# Patient Record
Sex: Female | Born: 1995
Health system: Southern US, Community
[De-identification: ages and names within clinical notes are randomized; demographics above are authoritative.]

## PROBLEM LIST (undated history)

## (undated) HISTORY — PX: TONSILLECTOMY: SUR1361

## (undated) HISTORY — PX: ORTHOPEDIC SURGERY: SHX850

## (undated) HISTORY — PX: APPENDECTOMY: SHX54

---

## 2015-03-15 ENCOUNTER — Emergency Department (HOSPITAL_BASED_OUTPATIENT_CLINIC_OR_DEPARTMENT_OTHER)
Admission: EM | Admit: 2015-03-15 | Discharge: 2015-03-15 | Disposition: A | Discharge status: Home / Self Care | Payer: Managed Care, Other (non HMO) | Attending: Emergency Medicine | Admitting: Emergency Medicine

## 2015-03-15 ENCOUNTER — Encounter (HOSPITAL_BASED_OUTPATIENT_CLINIC_OR_DEPARTMENT_OTHER): Payer: Self-pay | Admitting: Emergency Medicine

## 2015-03-15 DIAGNOSIS — H578 Other specified disorders of eye and adnexa: Secondary | ICD-10-CM | POA: Diagnosis present

## 2015-03-15 DIAGNOSIS — H109 Unspecified conjunctivitis: Secondary | ICD-10-CM

## 2015-03-15 MED ORDER — TETRACAINE HCL 0.5 % OP SOLN: 1.0000 [drp] | Freq: Once | OPHTHALMIC | Status: DC

## 2015-03-15 MED ORDER — FLUORESCEIN SODIUM 1 MG OP STRP: 1.0000 | ORAL_STRIP | Freq: Once | OPHTHALMIC | Status: DC

## 2015-03-15 MED ORDER — CIPROFLOXACIN HCL 0.3 % OP SOLN
2.0000 [drp] | OPHTHALMIC | Status: DC
  Administered 2015-03-15: 2 [drp] via OPHTHALMIC
  Filled 2015-03-15: qty 2.5

## 2015-03-15 MED ORDER — TETRACAINE HCL 0.5 % OP SOLN
OPHTHALMIC | Status: AC
  Filled 2015-03-15: qty 2

## 2015-03-15 MED ORDER — FLUORESCEIN SODIUM 1 MG OP STRP
ORAL_STRIP | OPHTHALMIC | Status: AC
  Filled 2015-03-15: qty 1

## 2015-03-15 MED ORDER — CIPROFLOXACIN HCL 0.3 % OP SOLN: 2.0000 [drp] | OPHTHALMIC | Status: DC

## 2015-03-15 NOTE — ED Provider Notes (Signed)
CSN: [id number]     Arrival date & time 03/15/15  0000 History  This chart was scribed for Paula LibraJohn Isel Skufca, MD by Ronney LionSuzanne Le, ED Scribe. This patient was seen in room MH01/MH01 and the patient's care was started at 12:26 AM.      Chief Complaint  Patient presents with  . Eye Pain and Swelling    The history is provided by the patient. No language interpreter was used.     HPI Comments: Kelsey Daniel is a 19 y.o. female who presents to the Emergency Department complaining of constant, worsening left eye pain that began without provocation 8 hours ago and gradually began to swell increasingly throughout the day. She endorses associated blurred vision. Patient states she was in the middle of a URI when this onset.   History reviewed. No pertinent past medical history. Past Surgical History  Procedure Laterality Date  . Tonsillectomy    . Orthopedic surgery     No family history on file. History  Substance Use Topics  . Smoking status: Never Smoker   . Smokeless tobacco: Not on file  . Alcohol Use: Yes     Comment: occ   OB History    No data available     Review of Systems  A complete 10 system review of systems was obtained and all systems are negative except as noted in the HPI and PMH.    Allergies  Review of patient's allergies indicates no known allergies.  Home Medications   Prior to Admission medications   Medication Sig Start Date End Date Taking? Authorizing Provider  levonorgestrel (MIRENA) 20 MCG/24HR IUD 1 each by Intrauterine route once.   Yes Historical Provider, MD   BP 142/99 mmHg  Pulse 83  Temp(Src) 98.5 F (36.9 C) (Oral)  Resp 18  Ht 5\' 9"  (1.753 m)  Wt 128 lb (58.06 kg)  BMI 18.89 kg/m2  SpO2 96%   Physical Exam  Nursing note and vitals reviewed. General: Well-developed, well-nourished female in no acute distress; appearance consistent with age of record HENT: normocephalic; atraumatic Eyes: pupils equal, round and reactive to light; extraocular  muscles intact; edema, without erythema, of the left eyelids with left conjunctival injection and mucoid discharge; no tenderness over the tear duct medially; no fluorescein uptake of the left cornea Neck: supple Heart: regular rate and rhythm Lungs: clear to auscultation bilaterally Abdomen: soft; nondistended; nontender; no masses or hepatosplenomegaly; bowel sounds present Extremities: No deformity; full range of motion; pulses normal Neurologic: Awake, alert and oriented; motor function intact in all extremities and symmetric; no facial droop Skin: Warm and dry Psychiatric: Normal mood and affect   ED Course  Procedures (including critical care time)  DIAGNOSTIC STUDIES: Oxygen Saturation is 96% on RA, normal by my interpretation.    COORDINATION OF CARE: 12:34 AM - Suspect conjunctivitis. Discussed treatment plan with pt at bedside which includes antibiotic eyedrops, and pt agreed to plan.  MDM   Final diagnoses:  Conjunctivitis of left eye   I personally performed the services described in this documentation, which was scribed in my presence. The recorded information has been reviewed and is accurate.    Paula LibraJohn Krimson Massmann, MD 03/15/15 [phone]

## 2015-03-15 NOTE — ED Notes (Signed)
Left eye swollen and painful

## 2015-03-15 NOTE — Discharge Instructions (Signed)

## 2016-09-27 ENCOUNTER — Emergency Department (HOSPITAL_BASED_OUTPATIENT_CLINIC_OR_DEPARTMENT_OTHER)
Admission: EM | Admit: 2016-09-27 | Discharge: 2016-09-27 | Disposition: A | Discharge status: Home / Self Care | Payer: Managed Care, Other (non HMO) | Attending: Emergency Medicine | Admitting: Emergency Medicine

## 2016-09-27 ENCOUNTER — Encounter (HOSPITAL_BASED_OUTPATIENT_CLINIC_OR_DEPARTMENT_OTHER): Payer: Self-pay | Admitting: *Deleted

## 2016-09-27 DIAGNOSIS — H6011 Cellulitis of right external ear: Secondary | ICD-10-CM | POA: Diagnosis not present

## 2016-09-27 DIAGNOSIS — R220 Localized swelling, mass and lump, head: Secondary | ICD-10-CM | POA: Diagnosis present

## 2016-09-27 MED ORDER — CEPHALEXIN 500 MG PO CAPS: 500.0000 mg | ORAL_CAPSULE | Freq: Four times a day (QID) | ORAL | 0 refills | Status: DC

## 2016-09-27 MED FILL — CEPHALEXIN 500 MG CAPSULE: 500 | 5 days supply | Qty: 20 | Fill #0

## 2016-09-27 NOTE — ED Provider Notes (Signed)
[facility]   CSN: [id number] Arrival date & time: 09/27/16  1507     History   Chief Complaint Chief Complaint  Patient presents with  . Abscess    HPI Kelsey Daniel is a 20 y.o. female.  HPI Patient presents with right ear redness and swelling that began 5 days ago.  She states it has gotten worse.  Mild pain.  No medications PTA.  Nothing makes her symptoms better or worse.  She states "my skin kept coming off" and she was applying hydrogen peroxide to the area.  No fever, chills, or any other symptoms. She did have a new ear piercing in that area about 6 weeks ago.  History reviewed. No pertinent past medical history.  There are no active problems to display for this patient.   Past Surgical History:  Procedure Laterality Date  . APPENDECTOMY    . ORTHOPEDIC SURGERY    . TONSILLECTOMY      OB History    No data available       Home Medications    Prior to Admission medications   Medication Sig Start Date End Date Taking? Authorizing Provider  cephALEXin (KEFLEX) 500 MG capsule Take 1 capsule (500 mg total) by mouth 4 (four) times daily. 09/27/16   Kelsey FowlerKayla Lulamae Skorupski, PA-C  levonorgestrel (MIRENA) 20 MCG/24HR IUD 1 each by Intrauterine route once.    Historical Provider, MD    Family History No family history on file.  Social History Social History  Substance Use Topics  . Smoking status: Never Smoker  . Smokeless tobacco: Never Used  . Alcohol use Yes     Comment: occ     Allergies   Patient has no known allergies.   Review of Systems Review of Systems All other systems negative unless otherwise stated in HPI   Physical Exam Updated Vital Signs BP 140/77 (BP Location: Left Arm)   Pulse 79   Temp 98.1 F (36.7 C) (Oral)   Resp 18   Ht 5\' 9"  (1.753 m)   Wt 63.5 kg   LMP 09/02/2016 (Approximate)   SpO2 99%   BMI 20.67 kg/m   Physical Exam  Constitutional: She is oriented to person, place, and time. She appears  well-developed and well-nourished.  HENT:  Head: Normocephalic and atraumatic.  Right Ear: External ear normal.  Left Ear: External ear normal.  Eyes: Conjunctivae are normal. No scleral icterus.  Neck: No tracheal deviation present.  Pulmonary/Chest: Effort normal. No respiratory distress.  Abdominal: She exhibits no distension.  Musculoskeletal: Normal range of motion.  Neurological: She is alert and oriented to person, place, and time.  Skin: Skin is warm and dry.  5 mm area of erythema and swelling to mid posterior helix.  No mastoid tenderness.  Psychiatric: She has a normal mood and affect. Her behavior is normal.     ED Treatments / Results  Labs (all labs ordered are listed, but only abnormal results are displayed) Labs Reviewed - No data to display  EKG  EKG Interpretation None       Radiology No results found.  Procedures Procedures (including critical care time)  Medications Ordered in ED Medications - No data to display   Initial Impression / Assessment and Plan / ED Course  I have reviewed the triage vital signs and the nursing notes.  Pertinent labs & imaging results that were available during my care of the patient were reviewed by me and considered in my medical decision  making (see chart for details).  Clinical Course    Patient with recent ear piercing presents with left ear erythema and swelling.  No systemic symptoms.  Concern for infectious vs inflammatory etiology.  Discharge home with keflex.  Recommend ibuprofen for pain.  Return precautions discussed.  Stable for discharge.  Final Clinical Impressions(s) / ED Diagnoses   Final diagnoses:  Cellulitis of earlobe, right    New Prescriptions New Prescriptions   CEPHALEXIN (KEFLEX) 500 MG CAPSULE    Take 1 capsule (500 mg total) by mouth 4 (four) times daily.     Kelsey FowlerKayla Earline Stiner, PA-C 09/27/16 1545    Geoffery Lyonsouglas Delo, MD 09/27/16 [phone]

## 2016-09-27 NOTE — Discharge Instructions (Signed)
Your symptoms could be infectious or inflammatory.  To cover for infection, start taking Keflex four times daily for 7 days.  You may take 800 mg Ibuprofen or extra strength tylenol every 6 hours for pain.  Return to the ED if you experience increased swelling, redness, pain, fever, or any new or concerning symptoms.

## 2016-09-27 NOTE — ED Notes (Signed)
Pt directed to pharmacy to pick up Rx 

## 2016-09-27 NOTE — ED Triage Notes (Signed)
Pt reports swelling to back of right ear since Saturday. New piercing 6 weeks ago. Pain is now in her neck

## 2016-09-28 ENCOUNTER — Encounter (HOSPITAL_BASED_OUTPATIENT_CLINIC_OR_DEPARTMENT_OTHER): Payer: Self-pay | Admitting: Emergency Medicine

## 2016-09-28 ENCOUNTER — Emergency Department (HOSPITAL_BASED_OUTPATIENT_CLINIC_OR_DEPARTMENT_OTHER)
Admission: EM | Admit: 2016-09-28 | Discharge: 2016-09-28 | Disposition: A | Discharge status: Home / Self Care | Payer: Managed Care, Other (non HMO) | Attending: Emergency Medicine | Admitting: Emergency Medicine

## 2016-09-28 DIAGNOSIS — Z79899 Other long term (current) drug therapy: Secondary | ICD-10-CM | POA: Diagnosis not present

## 2016-09-28 DIAGNOSIS — H6001 Abscess of right external ear: Secondary | ICD-10-CM | POA: Diagnosis not present

## 2016-09-28 DIAGNOSIS — H938X1 Other specified disorders of right ear: Secondary | ICD-10-CM | POA: Diagnosis present

## 2016-09-28 MED ORDER — SULFAMETHOXAZOLE-TRIMETHOPRIM 800-160 MG PO TABS: 1.0000 | ORAL_TABLET | Freq: Two times a day (BID) | ORAL | 0 refills | Status: AC

## 2016-09-28 NOTE — ED Notes (Signed)
ED Provider at bedside. 

## 2016-09-28 NOTE — ED Triage Notes (Signed)
Pt seen yesterday for cellulitis of right earlobe.  Sts she was told that if it got worse to come back, and it is worse.  Sts she has been taking the Keflex as prescribed.

## 2016-09-28 NOTE — ED Provider Notes (Signed)
[facility]   CSN: [id number] Arrival date & time: 09/28/16  1726     History   Chief Complaint Chief Complaint  Patient presents with  . Wound Check    HPI Kelsey Daniel is a 20 y.o. female.  HPI  20 year old female presents with right ear swelling and redness. She was seen here yesterday and told to come back and got worse. She has taken 4 doses of her Keflex and still feels like it's worsening. She tried to put in an earring a few days ago and noticed a low but of yellow drainage when she did. She has not tried this since. Otherwise it has not been draining. No fevers.  History reviewed. No pertinent past medical history.  There are no active problems to display for this patient.   Past Surgical History:  Procedure Laterality Date  . APPENDECTOMY    . ORTHOPEDIC SURGERY    . TONSILLECTOMY      OB History    No data available       Home Medications    Prior to Admission medications   Medication Sig Start Date End Date Taking? Authorizing Provider  cephALEXin (KEFLEX) 500 MG capsule Take 1 capsule (500 mg total) by mouth 4 (four) times daily. 09/27/16   Cheri FowlerKayla Rose, PA-C  levonorgestrel (MIRENA) 20 MCG/24HR IUD 1 each by Intrauterine route once.    Historical Provider, MD  sulfamethoxazole-trimethoprim (BACTRIM DS,SEPTRA DS) 800-160 MG tablet Take 1 tablet by mouth 2 (two) times daily. 09/28/16 10/05/16  Pricilla LovelessScott Mutasim Tuckey, MD    Family History No family history on file.  Social History Social History  Substance Use Topics  . Smoking status: Never Smoker  . Smokeless tobacco: Never Used  . Alcohol use Yes     Comment: occ     Allergies   Patient has no known allergies.   Review of Systems Review of Systems  Constitutional: Negative for fever.  HENT: Positive for ear pain.   Skin: Positive for color change and wound.  All other systems reviewed and are negative.    Physical Exam Updated Vital Signs BP 142/67 (BP  Location: Right Arm)   Pulse 70   Temp 98.5 F (36.9 C) (Oral)   Resp 16   Ht 5\' 9"  (1.753 m)   Wt 140 lb (63.5 kg)   LMP 09/02/2016 (Approximate)   SpO2 100%   BMI 20.67 kg/m   Physical Exam  Constitutional: She is oriented to person, place, and time. She appears well-developed and well-nourished.  HENT:  Head: Normocephalic and atraumatic.  Right Ear: External ear normal.  Left Ear: External ear normal.  Nose: Nose normal.  1 cm boggy erythematous swelling to posterior right lower ear lobe. No surrounding cellulitis  Eyes: Right eye exhibits no discharge. Left eye exhibits no discharge.  Pulmonary/Chest: Effort normal.  Abdominal: She exhibits no distension.  Neurological: She is alert and oriented to person, place, and time.  Skin: Skin is warm and dry. There is erythema.  Nursing note and vitals reviewed.    ED Treatments / Results  Labs (all labs ordered are listed, but only abnormal results are displayed) Labs Reviewed - No data to display  EKG  EKG Interpretation None       Radiology No results found.  Procedures .Marland Kitchen.Incision and Drainage Date/Time: 09/28/2016 5:57 PM Performed by: Pricilla LovelessGOLDSTON, Saddie Sandeen Authorized by: Pricilla LovelessGOLDSTON, Jayonna Meyering   Consent:    Consent obtained:  Verbal   Consent given by:  Patient  Risks discussed:  Bleeding, incomplete drainage, infection and pain   Alternatives discussed:  No treatment and alternative treatment Location:    Type:  Abscess   Size:  1 cm   Location:  Head   Head location:  R external ear Anesthesia (see MAR for exact dosages):    Anesthesia method:  None Procedure type:    Complexity:  Simple Procedure details:    Needle aspiration: yes     Needle size:  18 G   Incision depth:  Dermal   Drainage:  Bloody and purulent   Drainage amount:  Scant   Wound treatment:  Wound left open   Packing materials:  None Post-procedure details:    Patient tolerance of procedure:  Tolerated well, no immediate  complications   (including critical care time)  Medications Ordered in ED Medications - No data to display   Initial Impression / Assessment and Plan / ED Course  I have reviewed the triage vital signs and the nursing notes.  Pertinent labs & imaging results that were available during my care of the patient were reviewed by me and considered in my medical decision making (see chart for details).  Clinical Course     Discussed risks/benefits of needle drainage. Small amount of pus removed with needle drainage. Given this finding, will also cover with bactrim. No missed menstrual cycles, not currently pregnant. Local wound care, return here in 2 days if not improving.   Final Clinical Impressions(s) / ED Diagnoses   Final diagnoses:  Abscess of right external ear    New Prescriptions Discharge Medication List as of 09/28/2016  5:58 PM    START taking these medications   Details  sulfamethoxazole-trimethoprim (BACTRIM DS,SEPTRA DS) 800-160 MG tablet Take 1 tablet by mouth 2 (two) times daily., Starting Fri 09/28/2016, Until Fri 10/05/2016, Print         Pricilla LovelessScott Toris Laverdiere, MD 09/28/16 2144

## 2017-03-08 ENCOUNTER — Emergency Department (HOSPITAL_BASED_OUTPATIENT_CLINIC_OR_DEPARTMENT_OTHER): Payer: Managed Care, Other (non HMO)

## 2017-03-08 ENCOUNTER — Encounter (HOSPITAL_BASED_OUTPATIENT_CLINIC_OR_DEPARTMENT_OTHER): Payer: Self-pay | Admitting: *Deleted

## 2017-03-08 ENCOUNTER — Emergency Department (HOSPITAL_BASED_OUTPATIENT_CLINIC_OR_DEPARTMENT_OTHER)
Admission: EM | Admit: 2017-03-08 | Discharge: 2017-03-08 | Disposition: A | Discharge status: Home / Self Care | Payer: Managed Care, Other (non HMO) | Attending: Emergency Medicine | Admitting: Emergency Medicine

## 2017-03-08 DIAGNOSIS — R197 Diarrhea, unspecified: Secondary | ICD-10-CM

## 2017-03-08 DIAGNOSIS — R112 Nausea with vomiting, unspecified: Secondary | ICD-10-CM

## 2017-03-08 DIAGNOSIS — B349 Viral infection, unspecified: Secondary | ICD-10-CM | POA: Diagnosis not present

## 2017-03-08 LAB — CBC
HCT: 44.1 % (ref 36.0–46.0)
HEMOGLOBIN: 15.7 g/dL — AB (ref 12.0–15.0)
MCH: 30 pg (ref 26.0–34.0)
MCHC: 35.6 g/dL (ref 30.0–36.0)
MCV: 84.2 fL (ref 78.0–100.0)
Platelets: 153 10*3/uL (ref 150–400)
RBC: 5.24 MIL/uL — AB (ref 3.87–5.11)
RDW: 12.4 % (ref 11.5–15.5)
WBC: 7.8 10*3/uL (ref 4.0–10.5)

## 2017-03-08 LAB — BASIC METABOLIC PANEL
ANION GAP: 9 (ref 5–15)
BUN: 11 mg/dL (ref 6–20)
CHLORIDE: 102 mmol/L (ref 101–111)
CO2: 23 mmol/L (ref 22–32)
Calcium: 9.3 mg/dL (ref 8.9–10.3)
Creatinine, Ser: 0.78 mg/dL (ref 0.44–1.00)
GLUCOSE: 103 mg/dL — AB (ref 65–99)
Potassium: 3.7 mmol/L (ref 3.5–5.1)
Sodium: 134 mmol/L — ABNORMAL LOW (ref 135–145)

## 2017-03-08 LAB — URINALYSIS, ROUTINE W REFLEX MICROSCOPIC
Bilirubin Urine: NEGATIVE
Glucose, UA: NEGATIVE mg/dL
Ketones, ur: 15 mg/dL — AB
LEUKOCYTES UA: NEGATIVE
Nitrite: NEGATIVE
PH: 5.5 (ref 5.0–8.0)
Protein, ur: 30 mg/dL — AB
SPECIFIC GRAVITY, URINE: 1.016 (ref 1.005–1.030)

## 2017-03-08 LAB — URINALYSIS, MICROSCOPIC (REFLEX): RBC / HPF: NONE SEEN RBC/hpf (ref 0–5)

## 2017-03-08 LAB — PREGNANCY, URINE: Preg Test, Ur: NEGATIVE

## 2017-03-08 MED ORDER — LOPERAMIDE HCL 2 MG PO CAPS: 2.0000 mg | ORAL_CAPSULE | Freq: Four times a day (QID) | ORAL | 0 refills | Status: AC | PRN

## 2017-03-08 MED ORDER — LOPERAMIDE HCL 2 MG PO CAPS
4.0000 mg | ORAL_CAPSULE | Freq: Once | ORAL | Status: AC
  Administered 2017-03-08: 4 mg via ORAL
  Filled 2017-03-08: qty 2

## 2017-03-08 MED ORDER — ONDANSETRON 4 MG PO TBDP: 4.0000 mg | ORAL_TABLET | Freq: Three times a day (TID) | ORAL | 0 refills | Status: AC | PRN

## 2017-03-08 MED ORDER — SODIUM CHLORIDE 0.9 % IV BOLUS (SEPSIS)
500.0000 mL | Freq: Once | INTRAVENOUS | Status: AC
  Administered 2017-03-08: 500 mL via INTRAVENOUS

## 2017-03-08 MED ORDER — ONDANSETRON HCL 4 MG/2ML IJ SOLN
4.0000 mg | Freq: Once | INTRAMUSCULAR | Status: AC
  Administered 2017-03-08: 4 mg via INTRAVENOUS
  Filled 2017-03-08: qty 2

## 2017-03-08 NOTE — ED Provider Notes (Signed)
[facility]   CSN: [id number] Arrival date & time: 03/08/17  1644  By signing my name below, I, [name], attest that this documentation has been prepared under the direction and in the presence of Audry Pili, PA-C. Electronically Signed: [name], Scribe. 03/08/2017. 5:17 PM.  History   Chief Complaint Chief Complaint  Patient presents with  . Nausea  . Emesis  . Diarrhea    The history is provided by the patient. No language interpreter was used.     HPI Comments:  Kelsey Daniel is a 21 y.o. female who presents to the Emergency Department complaining of persistent diarrhea and vomiting x 3 days. She reports associated nausea, abdominal cramping, and decreased appetite. Pt states she went to Urgent care when symptoms began. At the time she was also experiencing chills, fever, and congestion. She was not tested for the flu but clinically diagnosed with the flu. Pt was discharged with Tamiflu which has not provided any relief of her current symptoms. Pt also notes all other symptoms have resolved except the nausea, vomiting and diarrhea. Pt denies CP, SOB, and dysuria.   History reviewed. No pertinent past medical history.  There are no active problems to display for this patient.   Past Surgical History:  Procedure Laterality Date  . APPENDECTOMY    . ORTHOPEDIC SURGERY    . TONSILLECTOMY      OB History    No data available       Home Medications    Prior to Admission medications   Medication Sig Start Date End Date Taking? Authorizing Provider  escitalopram (LEXAPRO) 10 MG tablet Take 10 mg by mouth daily.   Yes Historical Provider, MD  levonorgestrel (MIRENA) 20 MCG/24HR IUD 1 each by Intrauterine route once.    Historical Provider, MD    Family History History reviewed. No pertinent family history.  Social History Social History  Substance Use Topics  . Smoking status: Never Smoker  . Smokeless tobacco: Never Used  .  Alcohol use Yes     Comment: occ     Allergies   Patient has no known allergies.   Review of Systems Review of Systems  Constitutional: Positive for appetite change. Negative for chills and fever.  Respiratory: Negative for shortness of breath.   Cardiovascular: Negative for chest pain.  Gastrointestinal: Positive for abdominal pain, diarrhea, nausea and vomiting.  Genitourinary: Negative for dysuria.  All other systems reviewed and are negative.  Physical Exam Updated Vital Signs BP 135/86 (BP Location: Left Arm)   Pulse 78   Temp 98.7 F (37.1 C) (Oral)   Resp 18   Ht  (1.753 m)   Wt 136 lb (61.7 kg)   LMP 02/15/2017   SpO2 100%   BMI 20.08 kg/m   Physical Exam  Constitutional: She is oriented to person, place, and time. She appears well-developed and well-nourished. No distress.  HENT:  Head: Normocephalic and atraumatic.  Right Ear: Tympanic membrane, external ear and ear canal normal.  Left Ear: Tympanic membrane, external ear and ear canal normal.  Nose: Nose normal.  Mouth/Throat: Uvula is midline, oropharynx is clear and moist and mucous membranes are normal. No trismus in the jaw. No oropharyngeal exudate, posterior oropharyngeal erythema or tonsillar abscesses.  Eyes: Conjunctivae and EOM are normal. Pupils are equal, round, and reactive to light.  Neck: Normal range of motion. Neck supple. No tracheal deviation present.  Cardiovascular: Normal rate, regular rhythm, S1 normal, S2 normal, normal  heart sounds, intact distal pulses and normal pulses.   Pulmonary/Chest: Effort normal and breath sounds normal. No respiratory distress. She has no decreased breath sounds. She has no wheezes. She has no rhonchi. She has no rales.  Abdominal: Soft. Normal appearance and bowel sounds are normal. She exhibits no distension. There is tenderness.  Generalized abdominal tenderness  Musculoskeletal: Normal range of motion.  Neurological: She is alert and oriented to  person, place, and time.  Skin: Skin is warm and dry.  Psychiatric: She has a normal mood and affect. Her speech is normal and behavior is normal. Thought content normal.  Nursing note and vitals reviewed.  ED Treatments / Results  DIAGNOSTIC STUDIES:  Oxygen Saturation is 100% on RA, normal by my interpretation.    COORDINATION OF CARE:  5:12 PM Discussed treatment plan with pt at bedside and pt agreed to plan.  Labs (all labs ordered are listed, but only abnormal results are displayed) Labs Reviewed  CBC - Abnormal; Notable for the following:       Result Value   RBC 5.24 (*)    Hemoglobin 15.7 (*)    All other components within normal limits  BASIC METABOLIC PANEL - Abnormal; Notable for the following:    Sodium 134 (*)    Glucose, Bld 103 (*)    All other components within normal limits  URINALYSIS, ROUTINE W REFLEX MICROSCOPIC  PREGNANCY, URINE    EKG  EKG Interpretation None       Radiology Dg Chest 2 View  Result Date: 03/08/2017 CLINICAL DATA:  Persistent diarrhea and vomiting for 3 days. EXAM: CHEST  2 VIEW COMPARISON:  None. FINDINGS: The heart size and mediastinal contours are within normal limits. Both lungs are clear. The visualized skeletal structures are unremarkable. IMPRESSION: No active cardiopulmonary disease. Electronically Signed   By: Kennith Center M.D.   On: 03/08/2017 17:30    Procedures Procedures (including critical care time)  Medications Ordered in ED Medications  sodium chloride 0.9 % bolus 500 mL (500 mLs Intravenous New Bag/Given 03/08/17 1728)  loperamide (IMODIUM) capsule 4 mg (4 mg Oral Given 03/08/17 1729)  ondansetron (ZOFRAN) injection 4 mg (4 mg Intravenous Given 03/08/17 1728)     Initial Impression / Assessment and Plan / ED Course  I have reviewed the triage vital signs and the nursing notes.  Pertinent labs & imaging results that were available during my care of the patient were reviewed by me and considered in my medical  decision making (see chart for details).  {I have reviewed and evaluated the relevant laboratory values. {I have reviewed and evaluated the relevant imaging studies.  {I have reviewed the relevant previous healthcare records.  {I obtained HPI from historian.   ED Course:  Assessment: Patient with symptoms consistent with influenza. Currently being treated. Vitals are stable.  No signs of dehydration, tolerating PO's.  Lungs are clear. CBC unremarkable. BMP unremarkabl. UA negative. CXR unremarkable. Given fluids in ED. Patient will be discharged with instructions to orally hydrate, rest, and use over-the-counter medications such as anti-inflammatories ibuprofen and Aleve for muscle aches and Tylenol for fever.  Patient will also be given a cough suppressant.   Disposition/Plan:  DC Home Additional Verbal discharge instructions given and discussed with patient.  Pt Instructed to f/u with PCP in the next week for evaluation and treatment of symptoms. Return precautions given Pt acknowledges and agrees with plan  Supervising Physician Laurence Spates, MD  Final Clinical Impressions(s) / ED Diagnoses  Final diagnoses:  Nausea vomiting and diarrhea  Viral syndrome    New Prescriptions New Prescriptions   No medications on file   I personally performed the services described in this documentation, which was scribed in my presence. The recorded information has been reviewed and is accurate.     Audry Pili, PA-C 03/08/17 1829    Laurence Spates, MD 03/09/17 [phone]

## 2017-03-08 NOTE — ED Triage Notes (Signed)
Pt reports N/V/D x 2 days.  Pt being treated for the flu, states that she was given bentyl and tamiflu.  Pt reports fever has stopped, continues to have abdominal cramping when eating.

## 2017-03-08 NOTE — Discharge Instructions (Signed)
Please read and follow all provided instructions.  Your diagnoses today include:  1. Nausea vomiting and diarrhea   2. Viral syndrome     You appear to have an upper respiratory infection (URI). An upper respiratory tract infection, or cold, is a viral infection of the air passages leading to the lungs. It should improve gradually after 5-7 days. You may have a lingering cough that lasts for 2- 4 weeks after the infection.  Tests performed today include: Vital signs. See below for your results today.   Medications prescribed:   Take any prescribed medications only as directed. Treatment for your infection is aimed at treating the symptoms. There are no medications, such as antibiotics, that will cure your infection.   Home care instructions:  Follow any educational materials contained in this packet.   Your illness is contagious and can be spread to others, especially during the first 3 or 4 days. It cannot be cured by antibiotics or other medicines. Take basic precautions such as washing your hands often, covering your mouth when you cough or sneeze, and avoiding public places where you could spread your illness to others.   Please continue drinking plenty of fluids.  Use over-the-counter medicines as needed as directed on packaging for symptom relief.  You may also use ibuprofen or tylenol as directed on packaging for pain or fever.  Do not take multiple medicines containing Tylenol or acetaminophen to avoid taking too much of this medication.  Follow-up instructions: Please follow-up with your primary care provider in the next 3 days for further evaluation of your symptoms if you are not feeling better.   Return instructions:  Please return to the Emergency Department if you experience worsening symptoms.  RETURN IMMEDIATELY IF you develop shortness of breath, confusion or altered mental status, a new rash, become dizzy, faint, or poorly responsive, or are unable to be cared for at  home. Please return if you have persistent vomiting and cannot keep down fluids or develop a fever that is not controlled by tylenol or motrin.   Please return if you have any other emergent concerns.  Additional Information:  Your vital signs today were: BP 135/86 (BP Location: Left Arm)    Pulse 78    Temp 98.7 F (37.1 C) (Oral)    Resp 18    Ht  (1.753 m)    Wt 61.7 kg    LMP 02/15/2017    SpO2 100%    BMI 20.08 kg/m  If your blood pressure (BP) was elevated above 135/85 this visit, please have this repeated by your doctor within one month. --------------

## 2017-10-15 ENCOUNTER — Emergency Department (HOSPITAL_BASED_OUTPATIENT_CLINIC_OR_DEPARTMENT_OTHER): Payer: 59

## 2017-10-15 ENCOUNTER — Encounter (HOSPITAL_BASED_OUTPATIENT_CLINIC_OR_DEPARTMENT_OTHER): Payer: Self-pay

## 2017-10-15 ENCOUNTER — Emergency Department (HOSPITAL_BASED_OUTPATIENT_CLINIC_OR_DEPARTMENT_OTHER)
Admission: EM | Admit: 2017-10-15 | Discharge: 2017-10-15 | Disposition: A | Discharge status: Home / Self Care | Payer: 59 | Attending: Emergency Medicine | Admitting: Emergency Medicine

## 2017-10-15 ENCOUNTER — Other Ambulatory Visit: Payer: Self-pay

## 2017-10-15 DIAGNOSIS — R102 Pelvic and perineal pain: Secondary | ICD-10-CM

## 2017-10-15 DIAGNOSIS — Z79899 Other long term (current) drug therapy: Secondary | ICD-10-CM | POA: Diagnosis not present

## 2017-10-15 DIAGNOSIS — N760 Acute vaginitis: Secondary | ICD-10-CM | POA: Insufficient documentation

## 2017-10-15 DIAGNOSIS — Z30431 Encounter for routine checking of intrauterine contraceptive device: Secondary | ICD-10-CM

## 2017-10-15 LAB — WET PREP, GENITAL
SPERM: NONE SEEN
TRICH WET PREP: NONE SEEN
Yeast Wet Prep HPF POC: NONE SEEN

## 2017-10-15 LAB — URINALYSIS, ROUTINE W REFLEX MICROSCOPIC
Bilirubin Urine: NEGATIVE
GLUCOSE, UA: NEGATIVE mg/dL
Hgb urine dipstick: NEGATIVE
Ketones, ur: NEGATIVE mg/dL
LEUKOCYTES UA: NEGATIVE
Nitrite: NEGATIVE
PH: 6 (ref 5.0–8.0)
Protein, ur: NEGATIVE mg/dL
Specific Gravity, Urine: <1.005 — ABNORMAL LOW (ref 1.005–1.030)

## 2017-10-15 LAB — PREGNANCY, URINE: Preg Test, Ur: NEGATIVE

## 2017-10-15 MED ORDER — AZITHROMYCIN 250 MG PO TABS
1000.0000 mg | ORAL_TABLET | Freq: Once | ORAL | Status: AC
  Administered 2017-10-15: 1000 mg via ORAL
  Filled 2017-10-15: qty 4

## 2017-10-15 MED ORDER — METRONIDAZOLE 500 MG PO TABS: 500.0000 mg | ORAL_TABLET | Freq: Two times a day (BID) | ORAL | 0 refills | Status: AC

## 2017-10-15 MED ORDER — CEFTRIAXONE SODIUM 250 MG IJ SOLR
250.0000 mg | Freq: Once | INTRAMUSCULAR | Status: AC
  Administered 2017-10-15: 250 mg via INTRAMUSCULAR
  Filled 2017-10-15: qty 250

## 2017-10-15 NOTE — ED Notes (Signed)
Patient transported to Ultrasound 

## 2017-10-15 NOTE — ED Notes (Signed)
Pt verbalizes understanding of d/c instructions and denies any further needs at this time. 

## 2017-10-15 NOTE — ED Triage Notes (Signed)
Pt states she has an IUD, but was having regular menstrual cycles. Pt states her menstrual cycles have stopped and now she is having sharp pelvic pains. Pt denies urinary symptoms or vaginal d/c.

## 2017-10-15 NOTE — ED Provider Notes (Signed)
[facility] EMERGENCY DEPARTMENT Provider Note   CSN: [id number] Arrival date & time: 10/15/17  1735     History   Chief Complaint Chief Complaint  Patient presents with  . Pelvic Pain    HPI Kelsey Daniel is a 21 y.o. female.  HPI  21 year old female presents with acute pelvic/vaginal pain.  For the last 3 weeks she has been having some mild lower abdominal cramping and low back pain.  Feels like a menstrual cycle but less severe.  She has had this particular IUD for about 9 months.  She developed worse pain today the felt like it was on the inside of her vagina.  Worsens whenever she tries to walk. No vaginal bleeding, or discharge. Pain is mild when at rest. No concern for STI or recent unprotected intercourse. No urinary symptoms.  History reviewed. No pertinent past medical history.  There are no active problems to display for this patient.   Past Surgical History:  Procedure Laterality Date  . APPENDECTOMY    . ORTHOPEDIC SURGERY    . TONSILLECTOMY      OB History    No data available       Home Medications    Prior to Admission medications   Medication Sig Start Date End Date Taking? Authorizing Provider  escitalopram (LEXAPRO) 10 MG tablet Take 10 mg by mouth daily.    [provider]  levonorgestrel (MIRENA) 20 MCG/24HR IUD 1 each by Intrauterine route once.    [provider]  loperamide (IMODIUM) 2 MG capsule Take 1 capsule (2 mg total) by mouth 4 (four) times daily as needed for diarrhea or loose stools. 03/08/17   Audry PiliMohr, Tyler, PA-C  metroNIDAZOLE (FLAGYL) 500 MG tablet Take 1 tablet (500 mg total) by mouth 2 (two) times daily. One po bid x 7 days 10/15/17   Pricilla LovelessGoldston, Leilah Polimeni, MD  ondansetron (ZOFRAN ODT) 4 MG disintegrating tablet Take 1 tablet (4 mg total) by mouth every 8 (eight) hours as needed for nausea or vomiting. 03/08/17   Audry PiliMohr, Tyler, PA-C    Family History No family history on file.  Social History Social History     Tobacco Use  . Smoking status: Never Smoker  . Smokeless tobacco: Never Used  Substance Use Topics  . Alcohol use: Yes    Comment: occ  . Drug use: No     Allergies   Patient has no known allergies.   Review of Systems Review of Systems  Constitutional: Negative for fever.  Gastrointestinal: Positive for abdominal pain. Negative for vomiting.  Genitourinary: Positive for pelvic pain and vaginal pain. Negative for decreased urine volume, dysuria, hematuria, vaginal bleeding and vaginal discharge.  Musculoskeletal: Positive for back pain.  All other systems reviewed and are negative.    Physical Exam Updated Vital Signs BP 129/87 (BP Location: Right Arm)   Pulse 76   Temp 98.3 F (36.8 C) (Oral)   Resp 16   Ht 5\' 8"  (1.727 m)   Wt 61.2 kg (135 lb)   LMP 09/04/2017 (Exact Date)   SpO2 100%   BMI 20.53 kg/m   Physical Exam  Constitutional: She is oriented to person, place, and time. She appears well-developed and well-nourished.  HENT:  Head: Normocephalic and atraumatic.  Right Ear: External ear normal.  Left Ear: External ear normal.  Nose: Nose normal.  Eyes: Right eye exhibits no discharge. Left eye exhibits no discharge.  Cardiovascular: Normal rate, regular rhythm and normal heart sounds.  Pulmonary/Chest: Effort  normal and breath sounds normal.  Abdominal: Soft. She exhibits no distension. There is no tenderness.  Genitourinary: There is no rash on the right labia. There is no rash on the left labia. Uterus is not tender. Cervix exhibits no motion tenderness. Vaginal discharge found.  Neurological: She is alert and oriented to person, place, and time.  Skin: Skin is warm and dry.  Nursing note and vitals reviewed.    ED Treatments / Results  Labs (all labs ordered are listed, but only abnormal results are displayed) Labs Reviewed  WET PREP, GENITAL - Abnormal; Notable for the following components:      Result Value   Clue Cells Wet Prep HPF POC  PRESENT (*)    WBC, Wet Prep HPF POC MANY (*)    All other components within normal limits  URINALYSIS, ROUTINE W REFLEX MICROSCOPIC - Abnormal; Notable for the following components:   Color, Urine STRAW (*)    Specific Gravity, Urine <1.005 (*)    All other components within normal limits  PREGNANCY, URINE  GC/CHLAMYDIA PROBE AMP ([place]) NOT AT [facility]    EKG  EKG Interpretation None       Radiology US Pelvic Complete With Transvaginal  Result Date: 10/15/2017 CLINICAL DATA:  Sudden onset pelvic pain 8 hours ago. EXAM: TRANSABDOMINAL AND TRANSVAGINAL ULTRASOUND OF PELVIS TECHNIQUE: Both transabdominal and transvaginal ultrasound examinations of the pelvis were performed. Transabdominal technique was performed for global imaging of the pelvis including uterus, ovaries, adnexal regions, and pelvic cul-de-sac. It was necessary to proceed with endovaginal exam following the transabdominal exam to visualize the bilateral ovaries. COMPARISON:  None. FINDINGS: Uterus Measurements: 7.1 x 3.5 x 4.2 cm. No fibroids or other mass visualized. Endometrium Thickness: 2 mm. No focal abnormality visualized. IUD appears appropriately positioned. Right ovary Measurements: 3.3 x 2.7 x 2.2 cm. Normal appearance/no adnexal mass. Left ovary Measurements: 3.5 x 2.3 x 2.3 cm. Normal appearance/no adnexal mass. Corpus luteum. Other findings No abnormal free fluid. IMPRESSION: Normal pelvic ultrasound. Electronically Signed   By: Obie Dredge M.D.   On: 10/15/2017 20:50    Procedures Procedures (including critical care time)  Medications Ordered in ED Medications  cefTRIAXone (ROCEPHIN) injection 250 mg (250 mg Intramuscular Given 10/15/17 2121)  azithromycin (ZITHROMAX) tablet 1,000 mg (1,000 mg Oral Given 10/15/17 2119)     Initial Impression / Assessment and Plan / ED Course  I have reviewed the triage vital signs and the nursing notes.  Pertinent labs & imaging results that were available  during my care of the patient were reviewed by me and considered in my medical decision making (see chart for details).     Patient's pelvic exam does not show much tenderness.  However she does have a significant amount of WBCs and clue cells.  Given the discharge seen, she will be treated for a possible infectious etiology.  However her pelvic exam is not really consistent with PID.  She does have significant increase in pain with standing although overall her pain is moderate at worst.  Ultrasound shows no acute issues with the IUD such as malpositioning or perforation.  Otherwise ultrasound is benign.  Thus, treat with antibiotics, counseled not to use EtOH with the Flagyl, and follow-up with OB/GYN.  Return precautions.  Final Clinical Impressions(s) / ED Diagnoses   Final diagnoses:  Acute vaginitis    ED Discharge Orders        Ordered    metroNIDAZOLE (FLAGYL) 500 MG tablet  2 times  daily     10/15/17 2112       Pricilla LovelessGoldston, Magdalynn Davilla, MD 10/15/17 [phone]

## 2017-10-16 LAB — GC/CHLAMYDIA PROBE AMP (~~LOC~~) NOT AT ARMC
Chlamydia: NEGATIVE
NEISSERIA GONORRHEA: NEGATIVE

## 2018-03-01 IMAGING — US US PELVIS COMPLETE TRANSABD/TRANSVAG
1 series · 14 of 25 positions shown · non-contrast
Comparison: None.

CLINICAL DATA: Sudden onset pelvic pain 8 hours ago.

EXAM:
TRANSABDOMINAL AND TRANSVAGINAL ULTRASOUND OF PELVIS
TECHNIQUE: Both transabdominal and transvaginal ultrasound examinations of the
pelvis were performed. Transabdominal technique was performed for
global imaging of the pelvis including uterus, ovaries, adnexal
regions, and pelvic cul-de-sac. It was necessary to proceed with
endovaginal exam following the transabdominal exam to visualize the
bilateral ovaries.

[Series 1: us pelvis complete transabd/transvag · 0.17mm/px · 14 of 46 slices shown]
[im 1/46]
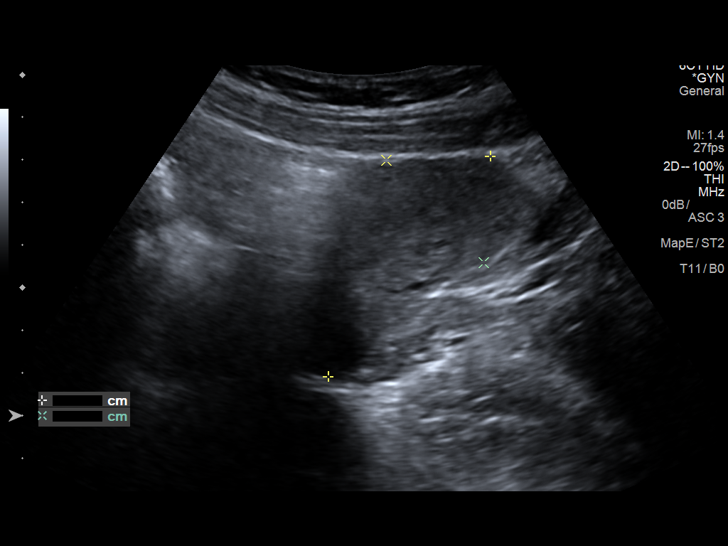
[im 4/46]
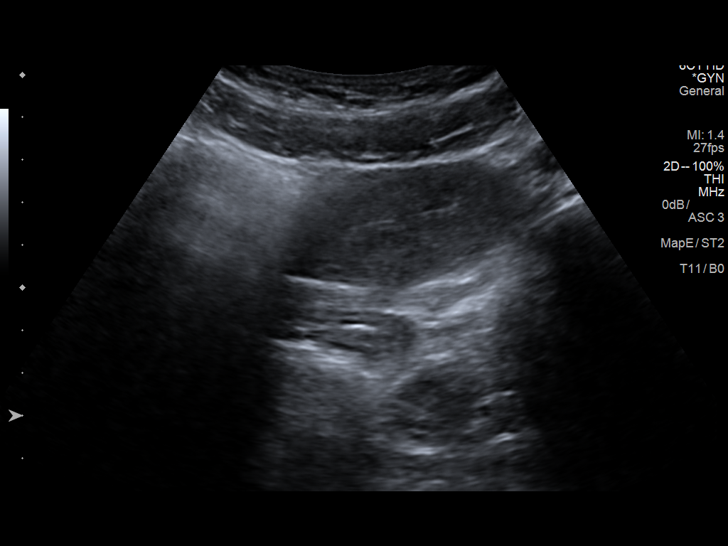
[im 8/46]
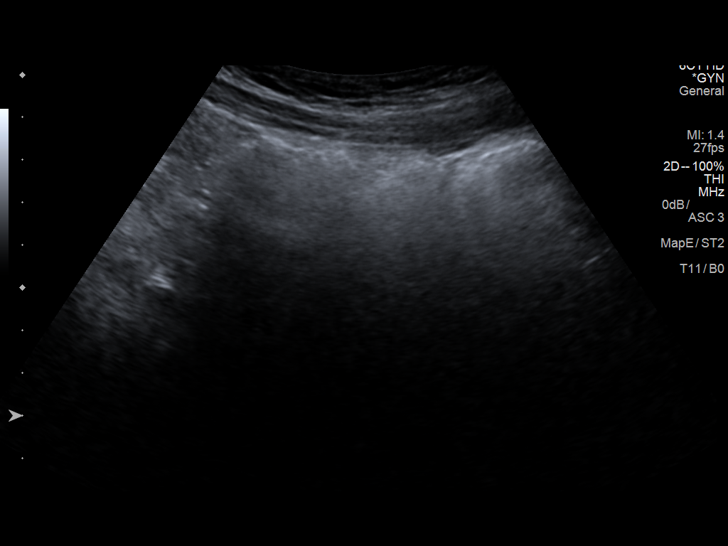
[im 12/46]
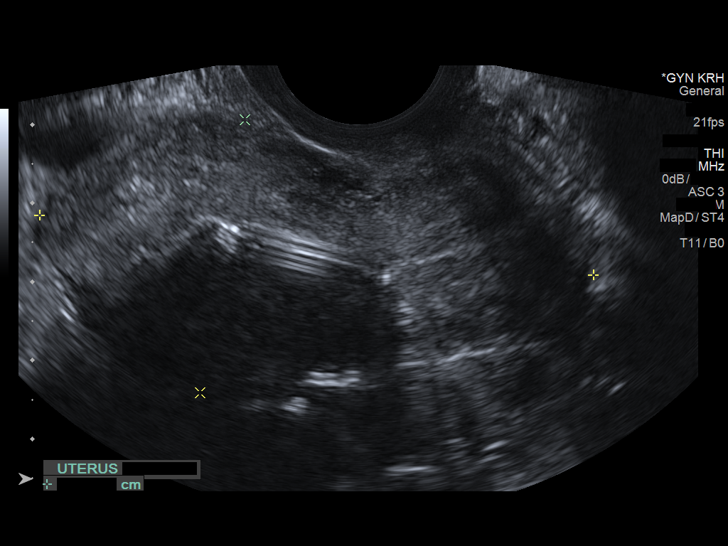
[im 16/46]
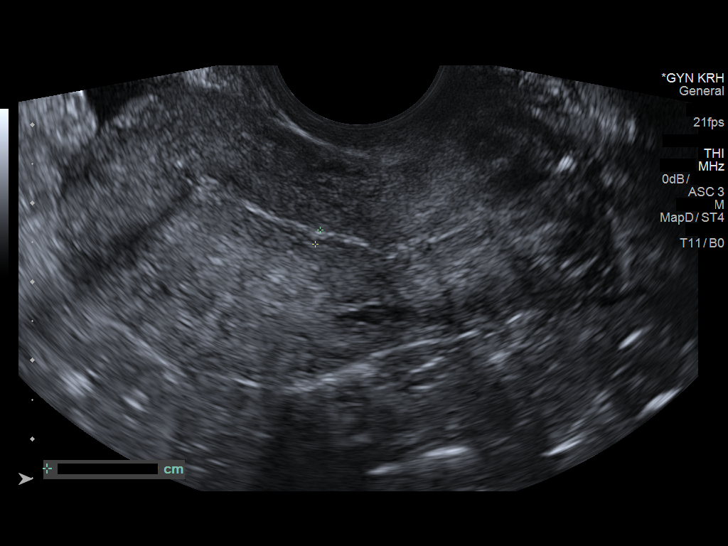
[im 17/46]
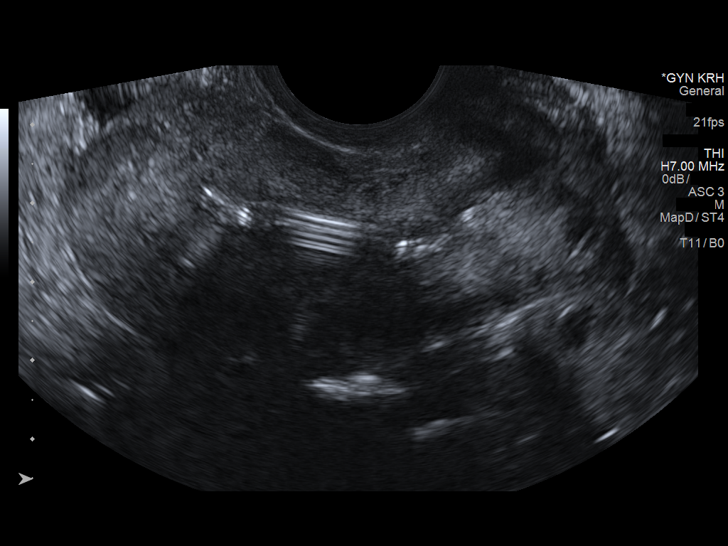
[im 21/46]
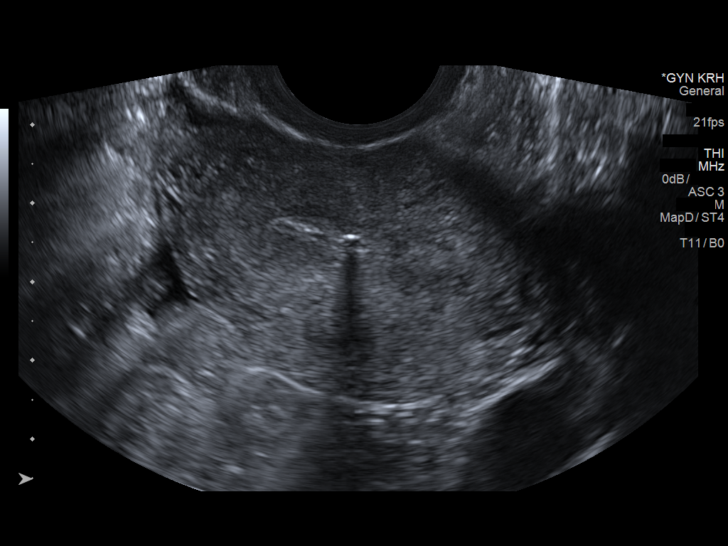
[im 25/46]
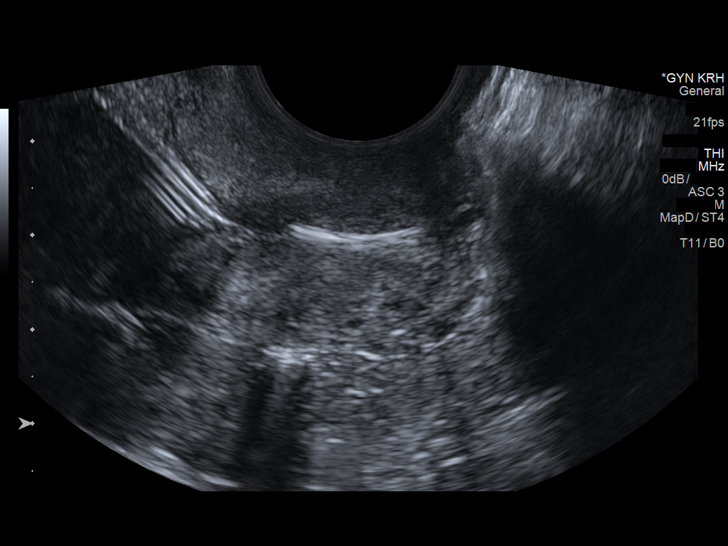
[im 29/46]
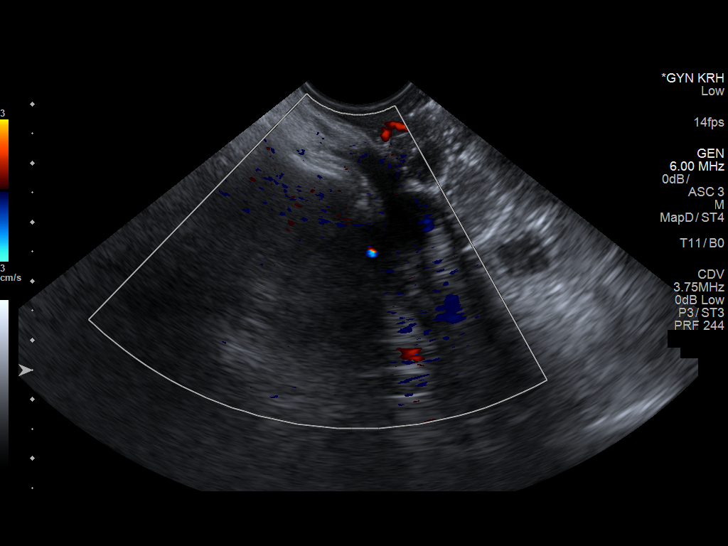
[im 31/46]
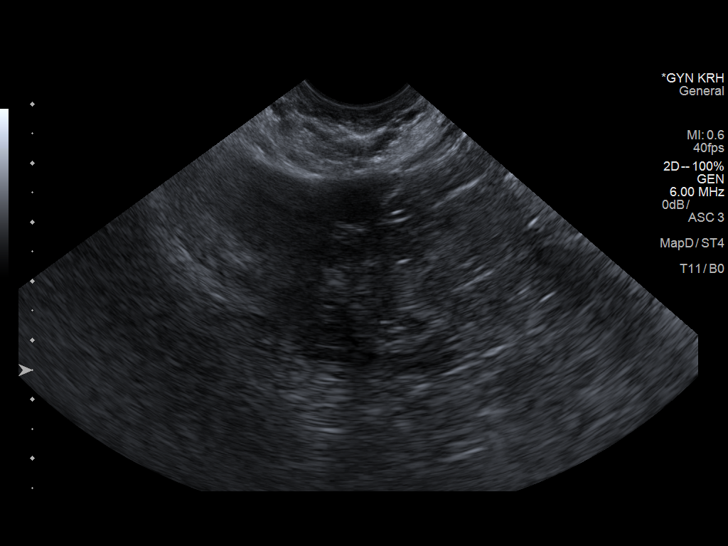
[im 34/46]
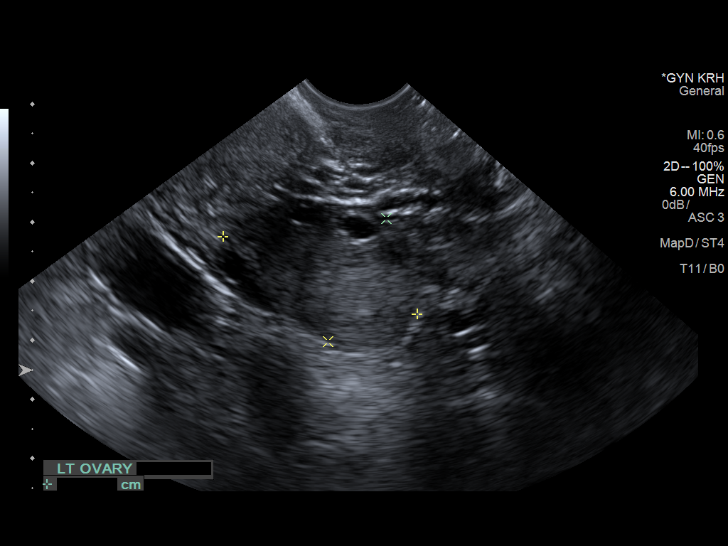
[im 38/46]
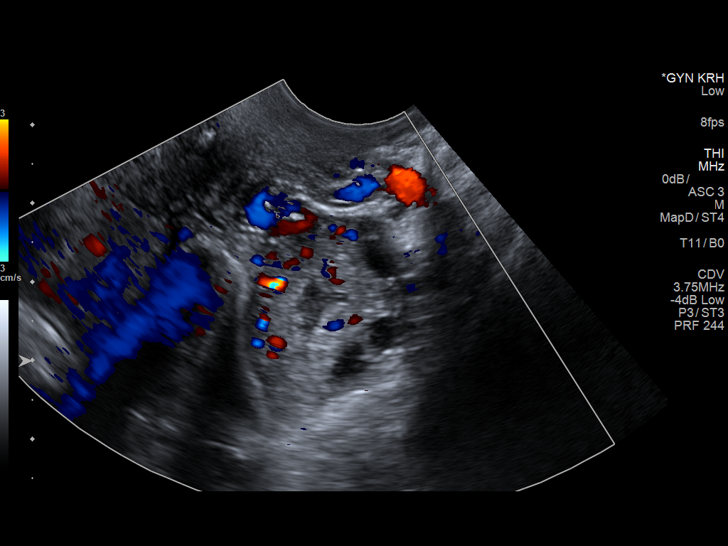
[im 42/46]
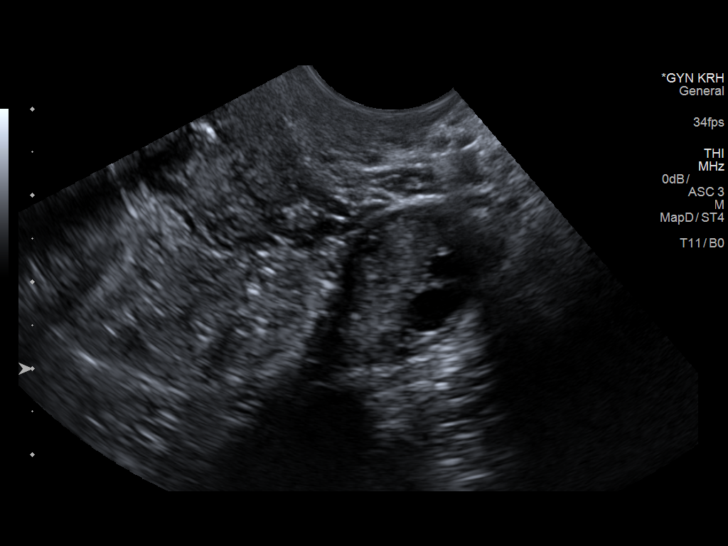
[im 46/46]
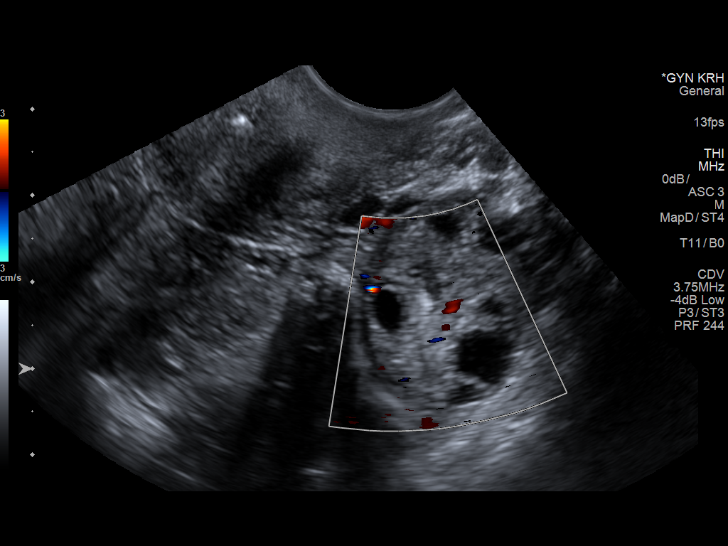

[14 of 25 positions shown; findings below may reference images not displayed]

FINDINGS: Uterus

Measurements: 7.1 x 3.5 x 4.2 cm. No fibroids or other mass
visualized.

Endometrium

Thickness: 2 mm. No focal abnormality visualized. IUD appears
appropriately positioned.

Right ovary

Measurements: 3.3 x 2.7 x 2.2 cm. Normal appearance/no adnexal mass.

Left ovary

Measurements: 3.5 x 2.3 x 2.3 cm. Normal appearance/no adnexal mass.
Corpus luteum.

Other findings

No abnormal free fluid.
IMPRESSION: Normal pelvic ultrasound.
